# Patient Record
Sex: Male | Born: 1960 | Race: White | Hispanic: No | Marital: Married | Smoking: Former smoker
Health system: Southern US, Community
[De-identification: ages and names within clinical notes are randomized; demographics above are authoritative.]

## PROBLEM LIST (undated history)

## (undated) DIAGNOSIS — J309 Allergic rhinitis, unspecified: Secondary | ICD-10-CM

## (undated) DIAGNOSIS — F419 Anxiety disorder, unspecified: Secondary | ICD-10-CM

## (undated) DIAGNOSIS — E785 Hyperlipidemia, unspecified: Secondary | ICD-10-CM

## (undated) DIAGNOSIS — S0291XA Unspecified fracture of skull, initial encounter for closed fracture: Secondary | ICD-10-CM

## (undated) DIAGNOSIS — S060XAA Concussion with loss of consciousness status unknown, initial encounter: Secondary | ICD-10-CM

## (undated) DIAGNOSIS — H47619 Cortical blindness, unspecified side of brain: Secondary | ICD-10-CM

## (undated) DIAGNOSIS — R413 Other amnesia: Secondary | ICD-10-CM

## (undated) DIAGNOSIS — F0280 Dementia in other diseases classified elsewhere without behavioral disturbance: Secondary | ICD-10-CM

## (undated) DIAGNOSIS — F32A Depression, unspecified: Secondary | ICD-10-CM

## (undated) DIAGNOSIS — R269 Unspecified abnormalities of gait and mobility: Secondary | ICD-10-CM

## (undated) DIAGNOSIS — Z8669 Personal history of other diseases of the nervous system and sense organs: Secondary | ICD-10-CM

## (undated) HISTORY — DX: Other amnesia: R41.3

## (undated) HISTORY — PX: COLONOSCOPY: SHX174

## (undated) HISTORY — DX: Allergic rhinitis, unspecified: J30.9

## (undated) HISTORY — DX: Cortical blindness, unspecified side of brain: H47.619

## (undated) HISTORY — DX: Personal history of other diseases of the nervous system and sense organs: Z86.69

## (undated) HISTORY — DX: Concussion with loss of consciousness status unknown, initial encounter: S06.0XAA

## (undated) HISTORY — DX: Unspecified abnormalities of gait and mobility: R26.9

## (undated) HISTORY — DX: Anxiety disorder, unspecified: F41.9

## (undated) HISTORY — DX: Depression, unspecified: F32.A

## (undated) HISTORY — DX: Hyperlipidemia, unspecified: E78.5

## (undated) HISTORY — DX: Unspecified fracture of skull, initial encounter for closed fracture: S02.91XA

## (undated) HISTORY — PX: POLYPECTOMY: SHX149

---
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
## 1984-02-27 HISTORY — PX: OTHER SURGICAL HISTORY: SHX169

## 1998-02-12 ENCOUNTER — Encounter: Payer: Self-pay | Admitting: Emergency Medicine

## 1998-02-12 ENCOUNTER — Encounter: Payer: Self-pay | Admitting: Orthopedic Surgery

## 1998-02-12 ENCOUNTER — Inpatient Hospital Stay (HOSPITAL_COMMUNITY): Admission: EM | Admit: 1998-02-12 | Discharge: 1998-02-15 | Payer: Self-pay | Admitting: Emergency Medicine

## 2000-02-27 HISTORY — PX: FEMUR FRACTURE SURGERY: SHX633

## 2000-09-27 ENCOUNTER — Emergency Department (HOSPITAL_COMMUNITY): Admission: EM | Admit: 2000-09-27 | Discharge: 2000-09-27 | Payer: Self-pay | Admitting: Emergency Medicine

## 2000-09-27 ENCOUNTER — Encounter: Payer: Self-pay | Admitting: Emergency Medicine

## 2004-05-05 ENCOUNTER — Ambulatory Visit (HOSPITAL_COMMUNITY): Admission: RE | Admit: 2004-05-05 | Discharge: 2004-05-05 | Payer: Self-pay | Admitting: Internal Medicine

## 2004-10-11 ENCOUNTER — Ambulatory Visit: Payer: Self-pay | Admitting: Internal Medicine

## 2004-10-27 ENCOUNTER — Ambulatory Visit: Payer: Self-pay | Admitting: Internal Medicine

## 2004-10-27 ENCOUNTER — Encounter (INDEPENDENT_AMBULATORY_CARE_PROVIDER_SITE_OTHER): Payer: Self-pay | Admitting: Specialist

## 2008-10-04 ENCOUNTER — Ambulatory Visit: Payer: Self-pay | Admitting: Internal Medicine

## 2008-10-22 ENCOUNTER — Encounter: Payer: Self-pay | Admitting: Internal Medicine

## 2008-10-22 ENCOUNTER — Ambulatory Visit: Payer: Self-pay | Admitting: Internal Medicine

## 2008-10-25 ENCOUNTER — Encounter: Payer: Self-pay | Admitting: Internal Medicine

## 2010-10-31 ENCOUNTER — Ambulatory Visit (INDEPENDENT_AMBULATORY_CARE_PROVIDER_SITE_OTHER): Payer: BC Managed Care – PPO | Admitting: Urology

## 2010-10-31 DIAGNOSIS — R361 Hematospermia: Secondary | ICD-10-CM

## 2011-10-30 ENCOUNTER — Encounter: Payer: Self-pay | Admitting: Internal Medicine

## 2012-03-11 ENCOUNTER — Ambulatory Visit (INDEPENDENT_AMBULATORY_CARE_PROVIDER_SITE_OTHER): Payer: BC Managed Care – PPO | Admitting: Urology

## 2012-03-11 DIAGNOSIS — R6882 Decreased libido: Secondary | ICD-10-CM

## 2012-03-11 DIAGNOSIS — N529 Male erectile dysfunction, unspecified: Secondary | ICD-10-CM

## 2012-03-11 DIAGNOSIS — R361 Hematospermia: Secondary | ICD-10-CM

## 2012-06-19 ENCOUNTER — Encounter: Payer: Self-pay | Admitting: Internal Medicine

## 2012-11-17 ENCOUNTER — Encounter: Payer: Self-pay | Admitting: Internal Medicine

## 2013-01-15 ENCOUNTER — Ambulatory Visit (AMBULATORY_SURGERY_CENTER): Payer: Self-pay | Admitting: *Deleted

## 2013-01-15 VITALS — Ht 68.0 in | Wt 171.8 lb

## 2013-01-15 DIAGNOSIS — Z86010 Personal history of colonic polyps: Secondary | ICD-10-CM

## 2013-01-15 MED ORDER — MOVIPREP 100 G PO SOLR: ORAL | Status: DC

## 2013-01-15 NOTE — Progress Notes (Signed)
No allergies to eggs or soy. No problems with anesthesia.  

## 2013-01-16 ENCOUNTER — Encounter: Payer: Self-pay | Admitting: Internal Medicine

## 2013-01-30 ENCOUNTER — Ambulatory Visit (AMBULATORY_SURGERY_CENTER): Payer: BC Managed Care – PPO | Admitting: Internal Medicine

## 2013-01-30 ENCOUNTER — Encounter: Payer: Self-pay | Admitting: Internal Medicine

## 2013-01-30 VITALS — BP 120/72 | HR 56 | Temp 97.0°F | Resp 14 | Ht 68.0 in | Wt 171.0 lb

## 2013-01-30 DIAGNOSIS — Z86010 Personal history of colonic polyps: Secondary | ICD-10-CM

## 2013-01-30 DIAGNOSIS — D126 Benign neoplasm of colon, unspecified: Secondary | ICD-10-CM

## 2013-01-30 MED ORDER — SODIUM CHLORIDE 0.9 % IV SOLN: 500.0000 mL | INTRAVENOUS | Status: DC

## 2013-01-30 NOTE — Progress Notes (Signed)
Patient did not have preoperative order for IV antibiotic SSI prophylaxis. (G8918)  Patient did not experience any of the following events: a burn prior to discharge; a fall within the facility; wrong site/side/patient/procedure/implant event; or a hospital transfer or hospital admission upon discharge from the facility. (G8907)  

## 2013-01-30 NOTE — Progress Notes (Signed)
Called to room to assist during endoscopic procedure.  Patient ID and intended procedure confirmed with present staff. Received instructions for my participation in the procedure from the performing physician.  

## 2013-01-30 NOTE — Patient Instructions (Signed)
YOU HAD AN ENDOSCOPIC PROCEDURE TODAY AT THE [facility]: Refer to the procedure report that was given to you for any specific questions about what was found during the examination.  If the procedure report does not answer your questions, please call your gastroenterologist to clarify.  If you requested that your care partner not be given the details of your procedure findings, then the procedure report has been included in a sealed envelope for you to review at your convenience later.  YOU SHOULD EXPECT: Some feelings of bloating in the abdomen. Passage of more gas than usual.  Walking can help get rid of the air that was put into your GI tract during the procedure and reduce the bloating. If you had a lower endoscopy (such as a colonoscopy or flexible sigmoidoscopy) you may notice spotting of blood in your stool or on the toilet paper. If you underwent a bowel prep for your procedure, then you may not have a normal bowel movement for a few days.  DIET: Your first meal following the procedure should be a light meal and then it is ok to progress to your normal diet.  A half-sandwich or bowl of soup is an example of a good first meal.  Heavy or fried foods are harder to digest and may make you feel nauseous or bloated.  Likewise meals heavy in dairy and vegetables can cause extra gas to form and this can also increase the bloating.  Drink plenty of fluids but you should avoid alcoholic beverages for 24 hours.  ACTIVITY: Your care partner should take you home directly after the procedure.  You should plan to take it easy, moving slowly for the rest of the day.  You can resume normal activity the day after the procedure however you should NOT DRIVE or use heavy machinery for 24 hours (because of the sedation medicines used during the test).    SYMPTOMS TO REPORT IMMEDIATELY: A gastroenterologist can be reached at any hour.  During normal business hours, 8:30 AM to 5:00 PM Monday through Friday,  call [phone].  After hours and on weekends, please call the GI answering service at [phone] who will take a message and have the physician on call contact you.   Following lower endoscopy (colonoscopy or flexible sigmoidoscopy):  Excessive amounts of blood in the stool  Significant tenderness or worsening of abdominal pains  Swelling of the abdomen that is new, acute  Fever of 100F or higher  FOLLOW UP: If any biopsies were taken you will be contacted by phone or by letter within the next 1-3 weeks.  Call your gastroenterologist if you have not heard about the biopsies in 3 weeks.  Our staff will call the home number listed on your records the next business day following your procedure to check on you and address any questions or concerns that you may have at that time regarding the information given to you following your procedure. This is a courtesy call and so if there is no answer at the home number and we have not heard from you through the emergency physician on call, we will assume that you have returned to your regular daily activities without incident.  SIGNATURES/CONFIDENTIALITY: You and/or your care partner have signed paperwork which will be entered into your electronic medical record.  These signatures attest to the fact that that the information above on your After Visit Summary has been reviewed and is understood.  Full responsibility of the confidentiality of this   discharge information lies with you and/or your care-partner.  Recommendations See procedure report  

## 2013-01-30 NOTE — Op Note (Signed)
[facility] [address] [place], [address]   COLONOSCOPY PROCEDURE REPORT  PATIENT: Jeremy Cain, Jeremy Cain  MR#: [id number] BIRTHDATE: 01-12-1961 , 52  yrs. old GENDER: Male ENDOSCOPIST: Roxy Cedar, MD REFERRED XB:[id number] Program Recall PROCEDURE DATE:  01/30/2013 PROCEDURE:   Colonoscopy with snare polypectomy x 3 First Screening Colonoscopy - Avg.  risk and is 50 yrs.  old or older - No.  Prior Negative Screening - Now for repeat screening. N/A  History of Adenoma - Now for follow-up colonoscopy & has been > or = to 3 yrs.  Yes hx of adenoma.  Has been 3 or more years since last colonoscopy.  Polyps Removed Today? Yes. ASA CLASS:   Class II INDICATIONS:Patient's personal history of adenomatous colon polyps(September 2006 with 20 mm tubulovillous adenoma. Last exam August 2010 with several sessile serrated adenomas) and Patient's immediate family history of colon cancer. MEDICATIONS: MAC sedation, administered by CRNA and propofol (Diprivan) 350mg  IV  DESCRIPTION OF PROCEDURE:   After the risks benefits and alternatives of the procedure were thoroughly explained, informed consent was obtained.  A digital rectal exam revealed no abnormalities of the rectum.   The LB [id number]  endoscope was introduced through the anus and advanced to the cecum, which was identified by both the appendix and ileocecal valve. No adverse events experienced.   The quality of the prep was good, using MoviPrep  The instrument was then slowly withdrawn as the colon was fully examined.  COLON FINDINGS: Three polyps were found in the ascending colon.Two 4 mm and one 9 mm sessile lesion.  A polypectomy was performed with a cold snare.  The resection was complete and the polyp tissue was completely retrieved.   Moderate diverticulosis was noted The finding was in the left colon.   The colon mucosa was otherwise normal.  Retroflexed views revealed internal hemorrhoids. The  time to cecum=1 minutes 59 seconds.  Withdrawal time=15 minutes 59 seconds.  The scope was withdrawn and the procedure completed. COMPLICATIONS: There were no complications.  ENDOSCOPIC IMPRESSION: 1.   Three polyps were found in the ascending colon; polypectomy was performed with a cold snare 2.   Moderate diverticulosis was noted in the left colon 3.   The colon mucosa was otherwise normal  RECOMMENDATIONS: 1.Repeat Colonoscopy in 3 years.   eSigned:  Roxy Cedar, MD 01/30/2013 10:27 AM   cc: Karleen Hampshire, MD and The Patient   PATIENT NAME:  Jeremy Cain, Jeremy Cain MR#: [id number]

## 2013-02-02 ENCOUNTER — Telehealth: Payer: Self-pay | Admitting: *Deleted

## 2013-02-02 NOTE — Telephone Encounter (Signed)
  Follow up Call-  Call back number 01/30/2013  Post procedure Call Back phone  # [phone]  Permission to leave phone message Yes     Patient questions:  Do you have a fever, pain , or abdominal swelling? no Pain Score  0 *  Have you tolerated food without any problems? yes  Have you been able to return to your normal activities? yes  Do you have any questions about your discharge instructions: Diet   no Medications  no Follow up visit  no  Do you have questions or concerns about your Care? no  Actions: * If pain score is 4 or above: No action needed, pain <4.

## 2013-02-03 ENCOUNTER — Encounter: Payer: Self-pay | Admitting: Internal Medicine

## 2014-04-20 ENCOUNTER — Ambulatory Visit (INDEPENDENT_AMBULATORY_CARE_PROVIDER_SITE_OTHER): Payer: BLUE CROSS/BLUE SHIELD | Admitting: Urology

## 2014-04-20 DIAGNOSIS — R361 Hematospermia: Secondary | ICD-10-CM

## 2014-04-20 DIAGNOSIS — N5201 Erectile dysfunction due to arterial insufficiency: Secondary | ICD-10-CM

## 2015-11-30 ENCOUNTER — Encounter: Payer: Self-pay | Admitting: Gastroenterology

## 2015-12-07 ENCOUNTER — Encounter: Payer: Self-pay | Admitting: Internal Medicine

## 2016-02-14 ENCOUNTER — Ambulatory Visit (INDEPENDENT_AMBULATORY_CARE_PROVIDER_SITE_OTHER): Payer: BLUE CROSS/BLUE SHIELD | Admitting: Urology

## 2016-02-14 DIAGNOSIS — N486 Induration penis plastica: Secondary | ICD-10-CM | POA: Diagnosis not present

## 2016-04-10 ENCOUNTER — Encounter: Payer: Self-pay | Admitting: Internal Medicine

## 2016-06-07 ENCOUNTER — Ambulatory Visit (AMBULATORY_SURGERY_CENTER): Payer: Self-pay | Admitting: *Deleted

## 2016-06-07 VITALS — Ht 67.0 in | Wt 161.8 lb

## 2016-06-07 DIAGNOSIS — Z86010 Personal history of colonic polyps: Secondary | ICD-10-CM

## 2016-06-07 DIAGNOSIS — Z800 Family history of malignant neoplasm of digestive organs: Secondary | ICD-10-CM

## 2016-06-07 MED ORDER — NA SULFATE-K SULFATE-MG SULF 17.5-3.13-1.6 GM/177ML PO SOLN: ORAL | 0 refills | Status: DC

## 2016-06-07 NOTE — Progress Notes (Signed)
Patient does NOT have an E-mail address.

## 2016-06-07 NOTE — Progress Notes (Signed)
Pt denies allergies to eggs or soy products. Denies difficulty with sedation or anesthesia. Denies any diet or weight loss medications. Denies use of supplemental oxygen.  Emmi instructions given for procedure.  

## 2016-06-12 ENCOUNTER — Encounter: Payer: Self-pay | Admitting: Internal Medicine

## 2016-06-14 ENCOUNTER — Other Ambulatory Visit: Payer: Self-pay

## 2016-06-14 DIAGNOSIS — Z800 Family history of malignant neoplasm of digestive organs: Secondary | ICD-10-CM

## 2016-06-14 DIAGNOSIS — Z86010 Personal history of colonic polyps: Secondary | ICD-10-CM

## 2016-06-14 MED ORDER — NA SULFATE-K SULFATE-MG SULF 17.5-3.13-1.6 GM/177ML PO SOLN: ORAL | 0 refills | Status: DC

## 2016-06-21 ENCOUNTER — Encounter: Payer: Self-pay | Admitting: Internal Medicine

## 2016-06-21 ENCOUNTER — Ambulatory Visit (AMBULATORY_SURGERY_CENTER): Payer: BLUE CROSS/BLUE SHIELD | Admitting: Internal Medicine

## 2016-06-21 VITALS — BP 116/80 | HR 53 | Temp 97.8°F | Resp 9 | Ht 67.0 in | Wt 161.0 lb

## 2016-06-21 DIAGNOSIS — Z800 Family history of malignant neoplasm of digestive organs: Secondary | ICD-10-CM

## 2016-06-21 DIAGNOSIS — Z86010 Personal history of colonic polyps: Secondary | ICD-10-CM | POA: Diagnosis not present

## 2016-06-21 DIAGNOSIS — D122 Benign neoplasm of ascending colon: Secondary | ICD-10-CM

## 2016-06-21 MED ORDER — SODIUM CHLORIDE 0.9 % IV SOLN: 500.0000 mL | INTRAVENOUS | Status: DC

## 2016-06-21 NOTE — Progress Notes (Signed)
Called to room to assist during endoscopic procedure.  Patient ID and intended procedure confirmed with present staff. Received instructions for my participation in the procedure from the performing physician.  

## 2016-06-21 NOTE — Patient Instructions (Signed)
YOU HAD AN ENDOSCOPIC PROCEDURE TODAY AT [facility]:   Refer to the procedure report that was given to you for any specific questions about what was found during the examination.  If the procedure report does not answer your questions, please call your gastroenterologist to clarify.  If you requested that your care partner not be given the details of your procedure findings, then the procedure report has been included in a sealed envelope for you to review at your convenience later.  YOU SHOULD EXPECT: Some feelings of bloating in the abdomen. Passage of more gas than usual.  Walking can help get rid of the air that was put into your GI tract during the procedure and reduce the bloating. If you had a lower endoscopy (such as a colonoscopy or flexible sigmoidoscopy) you may notice spotting of blood in your stool or on the toilet paper. If you underwent a bowel prep for your procedure, you may not have a normal bowel movement for a few days.  Please Note:  You might notice some irritation and congestion in your nose or some drainage.  This is from the oxygen used during your procedure.  There is no need for concern and it should clear up in a day or so.  SYMPTOMS TO REPORT IMMEDIATELY:   Following lower endoscopy (colonoscopy or flexible sigmoidoscopy):  Excessive amounts of blood in the stool  Significant tenderness or worsening of abdominal pains  Swelling of the abdomen that is new, acute  Fever of 100F or higher        For urgent or emergent issues, a gastroenterologist can be reached at any hour by calling [phone].   DIET:  We do recommend a small meal at first, but then you may proceed to your regular diet.  Drink plenty of fluids but you should avoid alcoholic beverages for 24 hours.  ACTIVITY:  You should plan to take it easy for the rest of today and you should NOT DRIVE or use heavy machinery until tomorrow (because of the sedation medicines used during the  test).    FOLLOW UP: Our staff will call the number listed on your records the next business day following your procedure to check on you and address any questions or concerns that you may have regarding the information given to you following your procedure. If we do not reach you, we will leave a message.  However, if you are feeling well and you are not experiencing any problems, there is no need to return our call.  We will assume that you have returned to your regular daily activities without incident.  If any biopsies were taken you will be contacted by phone or by letter within the next 1-3 weeks.  Please call us at [phone] if you have not heard about the biopsies in 3 weeks.    SIGNATURES/CONFIDENTIALITY: You and/or your care partner have signed paperwork which will be entered into your electronic medical record.  These signatures attest to the fact that that the information above on your After Visit Summary has been reviewed and is understood.  Full responsibility of the confidentiality of this discharge information lies with you and/or your care-partner.    INFORMATION ON POLYPS.DIVERTICULOSIS ,HEMORRHOIDS ,AND HIGH FIBER DIET GIVEN TO YOU TODAY  AWAIT LETTER FROM DR Henrene Pastor WITH PATHOLOGY RESULTS OF POLYP REMOVED TODAY

## 2016-06-21 NOTE — Progress Notes (Signed)
Patient awakening,vss,report to rn 

## 2016-06-21 NOTE — Op Note (Signed)
[facility] Patient Name: Jeremy Cain Procedure Date: 06/21/2016 11:15 AM MRN: [id number] Endoscopist: Docia Chuck. Henrene Pastor , MD Age: 56 Referring MD:  Date of Birth: November 23, 1960 Gender: Male Account #: [id number] Procedure:                Colonoscopy, with cold snare polypectomy X 1 Indications:              High risk colon cancer surveillance: Personal                            history of adenoma (10 mm or greater in size), High                            risk colon cancer surveillance: Personal history of                            adenoma with villous component, High risk colon                            cancer surveillance: Personal history of multiple                            (3 or more) adenomas. Previous examinations 2006,                            2010, 2014. Father with history of colorectal cancer Medicines:                Monitored Anesthesia Care Procedure:                Pre-Anesthesia Assessment:                           - Prior to the procedure, a History and Physical                            was performed, and patient medications and                            allergies were reviewed. The patient's tolerance of                            previous anesthesia was also reviewed. The risks                            and benefits of the procedure and the sedation                            options and risks were discussed with the patient.                            All questions were answered, and informed consent                            was obtained. Prior Anticoagulants: The patient has  taken no previous anticoagulant or antiplatelet                            agents. ASA Grade Assessment: II - A patient with                            mild systemic disease. After reviewing the risks                            and benefits, the patient was deemed in                            satisfactory condition to undergo the procedure.                        After obtaining informed consent, the colonoscope                            was passed under direct vision. Throughout the                            procedure, the patient's blood pressure, pulse, and                            oxygen saturations were monitored continuously. The                            Colonoscope was introduced through the anus and                            advanced to the the cecum, identified by                            appendiceal orifice and ileocecal valve. The                            ileocecal valve, appendiceal orifice, and rectum                            were photographed. The quality of the bowel                            preparation was good. The colonoscopy was performed                            without difficulty. The patient tolerated the                            procedure well. The bowel preparation used was                            SUPREP. Scope In: 11:25:17 AM Scope Out: 11:39:59 AM Scope Withdrawal Time: 0 hours 12 minutes 30 seconds  Total Procedure Duration: 0 hours 14 minutes 42 seconds  Findings:  A 2 mm polyp was found in the ascending colon. The                            polyp was removed with a cold snare. Resection and                            retrieval were complete.                           Diverticula were found in the sigmoid colon.                           Internal hemorrhoids were found during retroflexion. Complications:            No immediate complications. Estimated blood loss:                            None. Estimated Blood Loss:     Estimated blood loss: none. Impression:               - One 2 mm polyp in the ascending colon, removed                            with a cold snare. Resected and retrieved.                           - Diverticulosis in the sigmoid colon.                           - Internal hemorrhoids. Recommendation:           - Repeat colonoscopy in 5 years  for surveillance.                           - Patient has a contact number available for                            emergencies. The signs and symptoms of potential                            delayed complications were discussed with the                            patient. Return to normal activities tomorrow.                            Written discharge instructions were provided to the                            patient.                           - Resume previous diet.                           - Continue present medications.                           -  Await pathology results. Docia Chuck. Henrene Pastor, MD 06/21/2016 11:45:36 AM This report has been signed electronically.

## 2016-06-22 ENCOUNTER — Telehealth: Payer: Self-pay

## 2016-06-22 NOTE — Telephone Encounter (Signed)
  Follow up Call-  Call back number 06/21/2016  Post procedure Call Back phone  # [phone]  Permission to leave phone message Yes  Some recent data might be hidden     Patient questions:  Do you have a fever, pain , or abdominal swelling? No. Pain Score  0 *  Have you tolerated food without any problems? Yes.    Have you been able to return to your normal activities? Yes.    Do you have any questions about your discharge instructions: Diet   No. Medications  No. Follow up visit  No.  Do you have questions or concerns about your Care? No.  Actions: * If pain score is 4 or above: No action needed, pain <4.

## 2016-06-27 ENCOUNTER — Encounter: Payer: Self-pay | Admitting: Internal Medicine

## 2017-03-01 DIAGNOSIS — Z6824 Body mass index (BMI) 24.0-24.9, adult: Secondary | ICD-10-CM | POA: Diagnosis not present

## 2017-03-08 DIAGNOSIS — H16223 Keratoconjunctivitis sicca, not specified as Sjogren's, bilateral: Secondary | ICD-10-CM | POA: Diagnosis not present

## 2017-03-15 DIAGNOSIS — H04129 Dry eye syndrome of unspecified lacrimal gland: Secondary | ICD-10-CM | POA: Diagnosis not present

## 2017-03-15 DIAGNOSIS — H01003 Unspecified blepharitis right eye, unspecified eyelid: Secondary | ICD-10-CM | POA: Diagnosis not present

## 2017-04-19 DIAGNOSIS — H01003 Unspecified blepharitis right eye, unspecified eyelid: Secondary | ICD-10-CM | POA: Diagnosis not present

## 2017-05-09 DIAGNOSIS — H01003 Unspecified blepharitis right eye, unspecified eyelid: Secondary | ICD-10-CM | POA: Diagnosis not present

## 2017-05-16 DIAGNOSIS — R413 Other amnesia: Secondary | ICD-10-CM | POA: Diagnosis not present

## 2017-05-16 DIAGNOSIS — Z6824 Body mass index (BMI) 24.0-24.9, adult: Secondary | ICD-10-CM | POA: Diagnosis not present

## 2017-05-29 DIAGNOSIS — E039 Hypothyroidism, unspecified: Secondary | ICD-10-CM | POA: Diagnosis not present

## 2017-05-29 DIAGNOSIS — R5383 Other fatigue: Secondary | ICD-10-CM | POA: Diagnosis not present

## 2017-05-29 DIAGNOSIS — R413 Other amnesia: Secondary | ICD-10-CM | POA: Diagnosis not present

## 2017-05-29 DIAGNOSIS — H2589 Other age-related cataract: Secondary | ICD-10-CM | POA: Diagnosis not present

## 2017-05-29 DIAGNOSIS — D519 Vitamin B12 deficiency anemia, unspecified: Secondary | ICD-10-CM | POA: Diagnosis not present

## 2017-05-30 ENCOUNTER — Other Ambulatory Visit (HOSPITAL_COMMUNITY): Payer: Self-pay | Admitting: Neurology

## 2017-05-30 DIAGNOSIS — R413 Other amnesia: Secondary | ICD-10-CM

## 2017-05-30 DIAGNOSIS — F0390 Unspecified dementia without behavioral disturbance: Secondary | ICD-10-CM

## 2017-05-31 DIAGNOSIS — H25811 Combined forms of age-related cataract, right eye: Secondary | ICD-10-CM | POA: Diagnosis not present

## 2017-05-31 DIAGNOSIS — H25812 Combined forms of age-related cataract, left eye: Secondary | ICD-10-CM | POA: Diagnosis not present

## 2017-06-06 DIAGNOSIS — H2512 Age-related nuclear cataract, left eye: Secondary | ICD-10-CM | POA: Diagnosis not present

## 2017-06-06 DIAGNOSIS — H25812 Combined forms of age-related cataract, left eye: Secondary | ICD-10-CM | POA: Diagnosis not present

## 2017-06-10 ENCOUNTER — Ambulatory Visit (HOSPITAL_COMMUNITY)
Admission: RE | Admit: 2017-06-10 | Discharge: 2017-06-10 | Disposition: A | Discharge status: Home / Self Care | Payer: 59 | Source: Ambulatory Visit | Attending: Neurology | Admitting: Neurology

## 2017-06-10 DIAGNOSIS — F0390 Unspecified dementia without behavioral disturbance: Secondary | ICD-10-CM | POA: Insufficient documentation

## 2017-06-10 DIAGNOSIS — I6782 Cerebral ischemia: Secondary | ICD-10-CM | POA: Diagnosis not present

## 2017-06-10 DIAGNOSIS — G319 Degenerative disease of nervous system, unspecified: Secondary | ICD-10-CM | POA: Diagnosis not present

## 2017-06-10 DIAGNOSIS — R413 Other amnesia: Secondary | ICD-10-CM | POA: Insufficient documentation

## 2017-06-12 DIAGNOSIS — R413 Other amnesia: Secondary | ICD-10-CM | POA: Diagnosis not present

## 2017-06-12 DIAGNOSIS — J3089 Other allergic rhinitis: Secondary | ICD-10-CM | POA: Diagnosis not present

## 2017-07-05 DIAGNOSIS — H2511 Age-related nuclear cataract, right eye: Secondary | ICD-10-CM | POA: Diagnosis not present

## 2017-07-05 DIAGNOSIS — H25811 Combined forms of age-related cataract, right eye: Secondary | ICD-10-CM | POA: Diagnosis not present

## 2017-07-24 DIAGNOSIS — J3089 Other allergic rhinitis: Secondary | ICD-10-CM | POA: Diagnosis not present

## 2017-07-24 DIAGNOSIS — R413 Other amnesia: Secondary | ICD-10-CM | POA: Diagnosis not present

## 2017-07-25 ENCOUNTER — Encounter

## 2017-07-25 ENCOUNTER — Ambulatory Visit: Payer: BLUE CROSS/BLUE SHIELD | Admitting: Neurology

## 2017-10-24 DIAGNOSIS — G300 Alzheimer's disease with early onset: Secondary | ICD-10-CM | POA: Diagnosis not present

## 2017-10-24 DIAGNOSIS — H2589 Other age-related cataract: Secondary | ICD-10-CM | POA: Diagnosis not present

## 2017-11-17 DIAGNOSIS — Z23 Encounter for immunization: Secondary | ICD-10-CM | POA: Diagnosis not present

## 2017-11-25 DIAGNOSIS — G300 Alzheimer's disease with early onset: Secondary | ICD-10-CM | POA: Diagnosis not present

## 2017-11-25 DIAGNOSIS — H2589 Other age-related cataract: Secondary | ICD-10-CM | POA: Diagnosis not present

## 2018-03-25 DIAGNOSIS — H2589 Other age-related cataract: Secondary | ICD-10-CM | POA: Diagnosis not present

## 2018-03-25 DIAGNOSIS — G300 Alzheimer's disease with early onset: Secondary | ICD-10-CM | POA: Diagnosis not present

## 2018-05-16 DIAGNOSIS — G309 Alzheimer's disease, unspecified: Secondary | ICD-10-CM | POA: Diagnosis not present

## 2018-05-16 DIAGNOSIS — Z6826 Body mass index (BMI) 26.0-26.9, adult: Secondary | ICD-10-CM | POA: Diagnosis not present

## 2018-05-16 DIAGNOSIS — Z1389 Encounter for screening for other disorder: Secondary | ICD-10-CM | POA: Diagnosis not present

## 2018-05-16 DIAGNOSIS — Z0001 Encounter for general adult medical examination with abnormal findings: Secondary | ICD-10-CM | POA: Diagnosis not present

## 2018-05-16 DIAGNOSIS — E663 Overweight: Secondary | ICD-10-CM | POA: Diagnosis not present

## 2019-02-05 ENCOUNTER — Encounter: Payer: Self-pay | Admitting: Psychology

## 2019-02-05 ENCOUNTER — Other Ambulatory Visit: Payer: Self-pay

## 2019-02-05 ENCOUNTER — Encounter: Payer: 59 | Attending: Psychology | Admitting: Psychology

## 2019-02-05 ENCOUNTER — Encounter

## 2019-02-05 DIAGNOSIS — G300 Alzheimer's disease with early onset: Secondary | ICD-10-CM | POA: Diagnosis present

## 2019-02-05 DIAGNOSIS — F0789 Other personality and behavioral disorders due to known physiological condition: Secondary | ICD-10-CM | POA: Diagnosis not present

## 2019-02-05 DIAGNOSIS — F0280 Dementia in other diseases classified elsewhere without behavioral disturbance: Secondary | ICD-10-CM | POA: Insufficient documentation

## 2019-02-05 DIAGNOSIS — F09 Unspecified mental disorder due to known physiological condition: Secondary | ICD-10-CM
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
## 2019-02-16 ENCOUNTER — Other Ambulatory Visit: Payer: Self-pay

## 2019-02-16 ENCOUNTER — Encounter: Payer: 59 | Admitting: Psychology

## 2019-02-16 ENCOUNTER — Encounter: Payer: Self-pay | Admitting: Psychology

## 2019-02-16 DIAGNOSIS — F09 Unspecified mental disorder due to known physiological condition: Secondary | ICD-10-CM | POA: Diagnosis not present

## 2019-02-16 DIAGNOSIS — F0789 Other personality and behavioral disorders due to known physiological condition: Secondary | ICD-10-CM

## 2019-02-16 NOTE — Progress Notes (Addendum)
The patient arrived on time to his 8:00 testing appointment and was accompanied by his wife. The evaluation lasted 240 minutes.   Behavioral Observations:  Appearance: Casually and appropriately dressed with good hygiene. Gait: Ambulated independently without assistance (e.g., slow pace, little to no movement in arms when walking) Speech: Mostly clear, reduced rate, normal tone & volume Thought process:  Linear, somewhat disorganized, and concrete. Confusion and perseveration noted.  Mood/Affect:    Depressed, blunted.  Interpersonal: Polite and appropriate. Orientation: Oriented x 4 Effort/Motivation: Adequate    He did appear to have some residual visual impairment from previous cataract surgery. He had difficulty understanding the instructions for most measures and requires a significant amount of additional prompting to obtain meaningful results. He exhibited adequate distress tolerance on questions he did not know or tasks that were more difficult. 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 Block Design subtest).    Tests Administered: . Clock Drawing Test . Wechsler Adult Intelligence Scale, 4th Edition (WAIS-IV) . Wechsler Memory Scale, 4th edition, Older Adult Battery (WMS-IV-OA) Results:  Clock Drawing Test . Impaired  WAIS-IV  Composite Score Summary  Scale Sum of Scaled Scores Composite Score Percentile Rank 95% Conf. Interval Qualitative Description  Verbal Comprehension 18 VCI 78 7 73-85 Borderline  Perceptual Reasoning 5 PRI 51 0.1 47-60 Extremely Low  Working Memory 4 WMI 55 0.1 51-64 Extremely Low  Processing Speed 2 PSI 50 <0.1 47-63 Extremely Low  Full Scale 29 FSIQ 53 0.1 50-58 Extremely Low  General Ability 23 GAI 60 0.4 56-66 Extremely Low   Index Level Discrepancy Comparisons  Comparison Score 1 Score 2 Difference Critical Value .05 Significant Difference Y/N Base Rate by Overall Sample  VCI - PRI 78 51 27 8.31 Y 2.6  VCI - WMI 78 55 23 8.82  Y 3.8  VCI - PSI 78 50 28 10.19 Y 4.4  PRI - WMI 51 55 -4 9.74 N 39.5  PRI - PSI 51 50 1 11.00 N 47.0  WMI - PSI 55 50 5 11.38 N 37.7  FSIQ - GAI 53 60 -7 3.51 Y 8.1   Differences Between Subtest and Overall Mean of Subtest Scores  Subtest Subtest Scaled Score Mean Scaled Score Difference Critical Value .05 Strength or Weakness Base Rate  Block Design 1 2.90 -1.90 2.85  >25%  Similarities 8 2.90 5.10 2.82 S <1%  Digit Span 3 2.90 0.10 2.22  >25%  Matrix Reasoning 3 2.90 0.10 2.54  >25%  Vocabulary 5 2.90 2.10 2.03 S 25%  Arithmetic 1 2.90 -1.90 2.73  >25%  Symbol Search 1 2.90 -1.90 3.42  >25%  Visual Puzzles 1 2.90 -1.90 2.71  >25%  Information 5 2.90 2.10 2.19  >25%  Coding 1 2.90 -1.90 2.97  >25%   WMS-IV (Older Adult Battery)     RAW SCORES  Subtest Score Range Adult Score Range Older Adult Raw Score  Brief Cognitive Status Exam 0-58 0-58   Logical Memory I 0-50 0-53 12  Logical Memory II 0-50 0-39 0  Verbal Paired Associates I 0-56 0-40 4  Verbal Paired Associates II 0-14 0-10 1  CVLT-II Trials 1-5 5-95 5-95   CVLT-II Long-Delay -5-5 -5-5   Designs I 0-120    Designs II 0-120    Visual Reproduction I 0-43 0-43 3  Visual Reproduction II 0-43 0-43 0  Spatial Addition 0-24    Symbol Span 0-50 0-50 2  Process Score Range Adult Score Range Older Adult Raw Score  LM II Recognition 0-30 0-23 15  VPA II Recognition 0-40 0-30 13  VPA II Word Recall 0-28 0-20   DE I Content 0-48    DE I Spatial 0-24    DE II Content 0-48    DE II Spatial 0-24    DE II Recognition 0-24    VR II Recognition 0-7 0-7 0  VR II Copy 0-43 0-43      Auditory Memory Process Score Summary  Process Score Raw Score Scaled Score Percentile Rank Cumulative Percentage (Base Rate)  LM II Recognition 15 - - <=2%  VPA II Recognition 13 - - <=2%    Visual Memory Process Score Summary  Process Score Raw Score Scaled Score Percentile Rank Cumulative Percentage (Base Rate)  VR II  Recognition 0 - - <=2%

## 2019-03-10 ENCOUNTER — Ambulatory Visit: Payer: 59 | Admitting: Psychology

## 2019-03-12 ENCOUNTER — Other Ambulatory Visit: Payer: Self-pay

## 2019-03-12 ENCOUNTER — Encounter: Payer: BLUE CROSS/BLUE SHIELD | Attending: Psychology | Admitting: Psychology

## 2019-03-12 ENCOUNTER — Encounter: Payer: Self-pay | Admitting: Psychology

## 2019-03-12 DIAGNOSIS — F0280 Dementia in other diseases classified elsewhere without behavioral disturbance: Secondary | ICD-10-CM

## 2019-03-12 DIAGNOSIS — F0789 Other personality and behavioral disorders due to known physiological condition: Secondary | ICD-10-CM | POA: Diagnosis present

## 2019-03-12 DIAGNOSIS — F09 Unspecified mental disorder due to known physiological condition: Secondary | ICD-10-CM | POA: Diagnosis present

## 2019-03-12 DIAGNOSIS — G300 Alzheimer's disease with early onset: Secondary | ICD-10-CM | POA: Diagnosis present
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
## 2019-03-24 ENCOUNTER — Encounter: Payer: Self-pay | Admitting: Psychology

## 2019-03-24 ENCOUNTER — Encounter: Payer: BLUE CROSS/BLUE SHIELD | Admitting: Psychology

## 2019-03-24 ENCOUNTER — Other Ambulatory Visit: Payer: Self-pay

## 2019-03-24 DIAGNOSIS — G300 Alzheimer's disease with early onset: Secondary | ICD-10-CM | POA: Diagnosis not present

## 2019-03-24 DIAGNOSIS — F0789 Other personality and behavioral disorders due to known physiological condition: Secondary | ICD-10-CM

## 2019-03-24 DIAGNOSIS — F09 Unspecified mental disorder due to known physiological condition: Secondary | ICD-10-CM

## 2019-03-24 DIAGNOSIS — F0280 Dementia in other diseases classified elsewhere without behavioral disturbance: Secondary | ICD-10-CM

## 2019-03-24 NOTE — Progress Notes (Signed)
Today I provided feedback to the patient and his wife regarding the results of the recent neuropsychological evaluation.  The complete report can be found in the patient's chart dated 03/12/2019.  Below you will find the summary and diagnostic impressions from that evaluation for convenience.  Today's visit was a 1 hour face-to-face clinical interview with the patient and his wife that was conducted in my outpatient clinic office.  The patient was able to understand the feedback and we reviewed the results of the testing suggesting early onset Alzheimer's dementia, which was not a particular surprise to the patient as he was concerned about his subjective symptoms he has had developing over the past several years.  We worked on treatment strategies going forward and the patient has a follow-up appointment with Dr. Merlene Laughter in the near future to review neurological options.    Summary of Results:                        Overall, the results of the current objective neuropsychological evaluation are consistent with significant ongoing cognitive deficits.  As much as we could, we adjusted for the visual impairments he has from previous cataract surgery and made great effort to ensure that he fully understood the instructions and requirements of each task given.  The patient showed significant deficits with regard to retrieval of information that should be long-term such as his vocabulary and general fund of information.  The patient displayed significant deficits for visual-spatial and visual constructional issues, significant visual reasoning and problem-solving abilities and significant auditory encoding deficits.  The patient showed average performance with regard to verbal reasoning and problem-solving and his social judgment and comprehension abilities.  The patient showed profound memory deficits both visual and auditory that were beyond those that could be explained by his impaired encoding functions and the  patient's memory functions did not improve with recognition formats and cueing.  Impression/Diagnosis:                     The results of the current neuropsychological evaluation are consistent with patterns typically seen with early onset dementia of the Alzheimer's type.  There is a family history of this diagnosis and early onset as well.  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  The patient is no longer driving at all.  The patient's MRI a year and a half ago indicated moderate atrophy and only mild chronic microvascular ischemia.  These findings would not suggest cerebrovascular disease sufficient to explain this level of cognitive and memory impairments.  While the patient and the family reports some issues with tremor there are no visual hallucinations reported and the tremor is mild and not associated with as an early symptom of his deficits versus the memory and cognitive functioning to be an early symptoms.  The pattern is not consistent with Lewy body dementia or Parkinson's or other cortically or subcortically mediated progressive degenerative dementia processes.  I will provide feedback to the patient and his family regarding the results of the current neuropsychological evaluation.  Diagnosis:                               Axis I: Early onset Alzheimer's  dementia without behavioral disturbance (HCC)  Cognitive and neurobehavioral dysfunction   Ilean Skill, Psy.D. Neuropsychologist

## 2019-04-03 IMAGING — MR MR HEAD W/O CM
8 of 10 series · 42 of 48 positions shown · non-contrast
Comparison: None.

CLINICAL DATA: Dementia without behavioral disturbance.

EXAM:
MRI HEAD WITHOUT CONTRAST
TECHNIQUE: Multiplanar, multiecho pulse sequences of the brain and surrounding
structures were obtained without intravenous contrast.

[Series 3: DWI · axial · 3.0mm · 0.69mm/px · z∈[-75,+87]mm · 7 of 55 slices shown (1 of 4)]
[im 1/55]
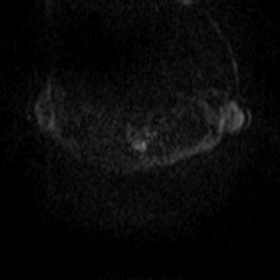
[im 10/55]
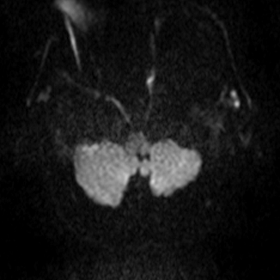
[im 19/55]
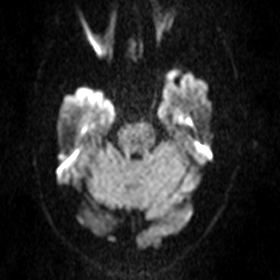
[im 28/55]
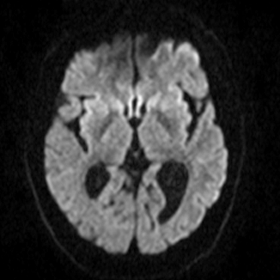
[im 37/55]
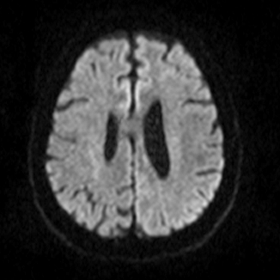
[im 46/55]
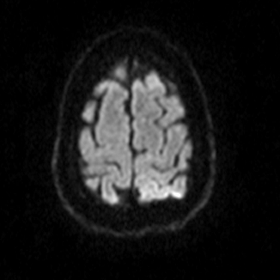
[im 55/55]
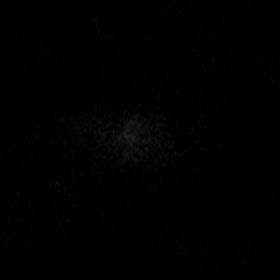

[Series 4: DWI · axial · 3.0mm · 0.72mm/px · z∈[-75,+87]mm · 7 of 55 slices shown (2 of 4)]
[im 1/55]
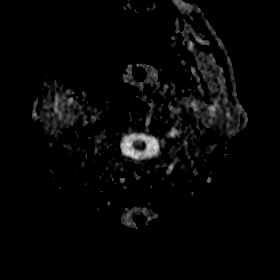
[im 10/55]
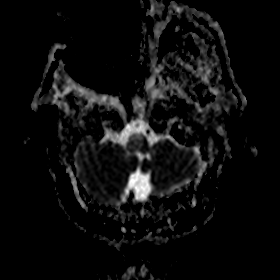
[im 19/55]
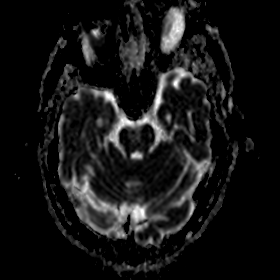
[im 28/55]
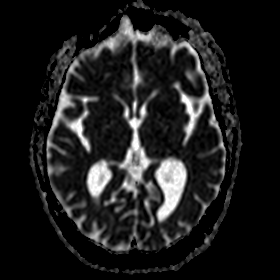
[im 37/55]
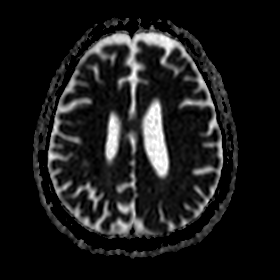
[im 46/55]
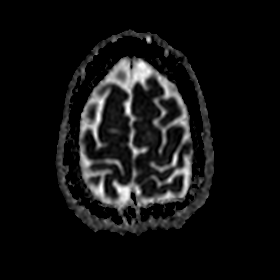
[im 55/55]
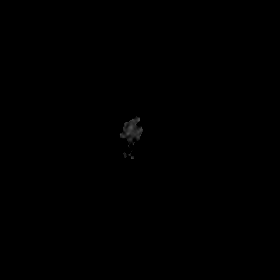

[Series 5: DWI · coronal · 5.0mm · 0.44mm/px · 4 of 34 slices shown (3 of 4)]
[im 1/34]
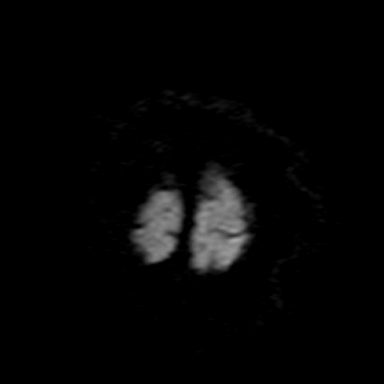
[im 12/34]
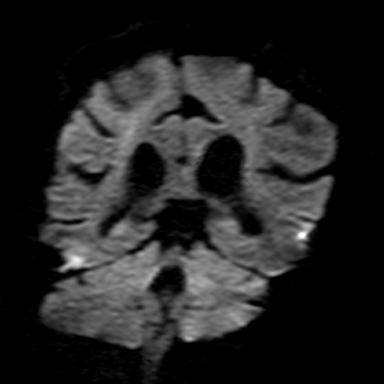
[im 23/34]
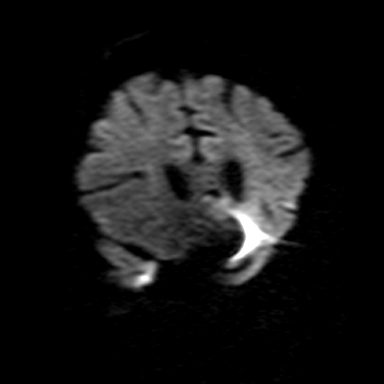
[im 34/34]
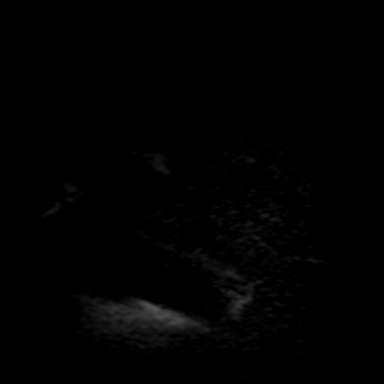

[Series 6: DWI · coronal · 5.0mm · 0.47mm/px · 4 of 34 slices shown (4 of 4)]
[im 1/34]
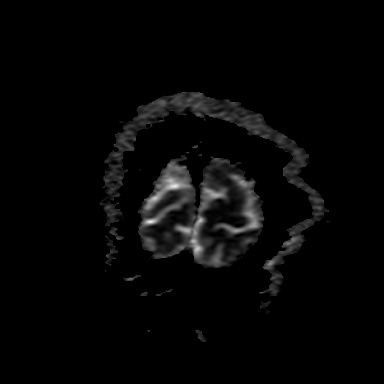
[im 12/34]
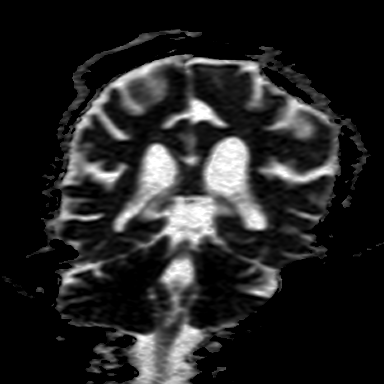
[im 23/34]
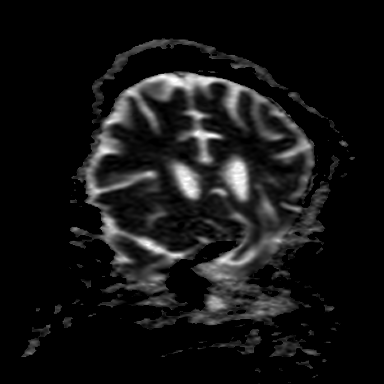
[im 34/34]
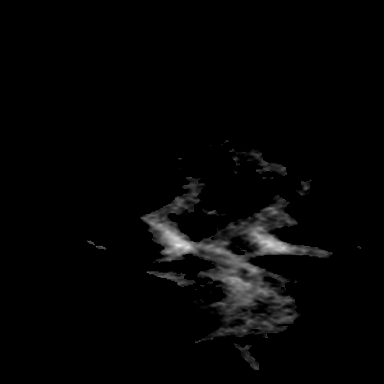

[Series 7: T2 · axial · 5.0mm · 0.63mm/px · z∈[-65,+78]mm · 3 of 23 slices shown (1 of 2)]
[im 1/23]
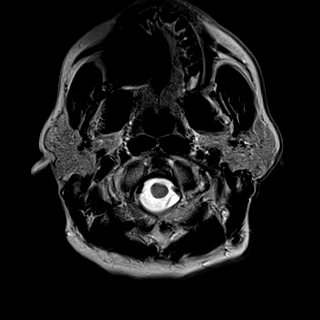
[im 12/23]
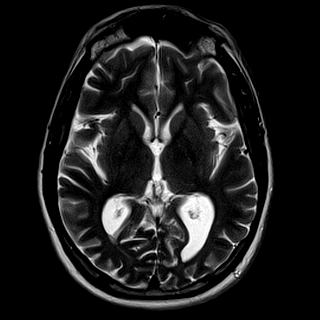
[im 23/23]
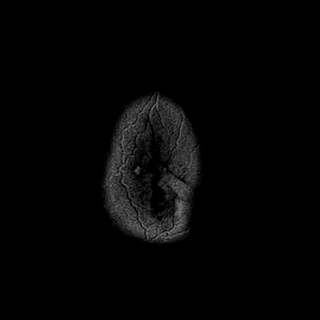

[Series 8: FLAIR · axial · 3.0mm · 0.82mm/px · z∈[-62,+75]mm · 6 of 47 slices shown]
[im 1/47]
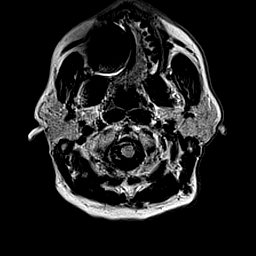
[im 10/47]
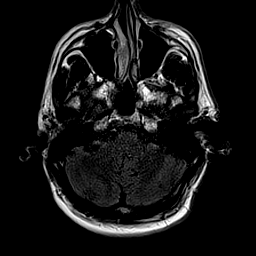
[im 19/47]
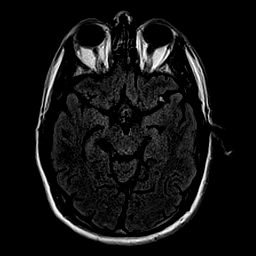
[im 28/47]
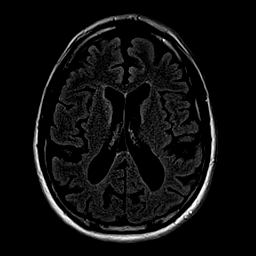
[im 37/47]
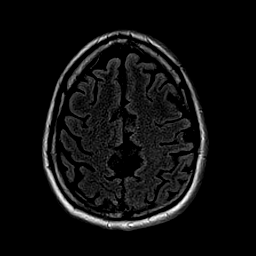
[im 47/47]
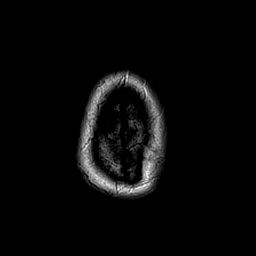

[Series 9: T1 · axial · 2.0mm · 0.40mm/px · z∈[-67,+83]mm · 8 of 76 slices shown]
[im 1/76]
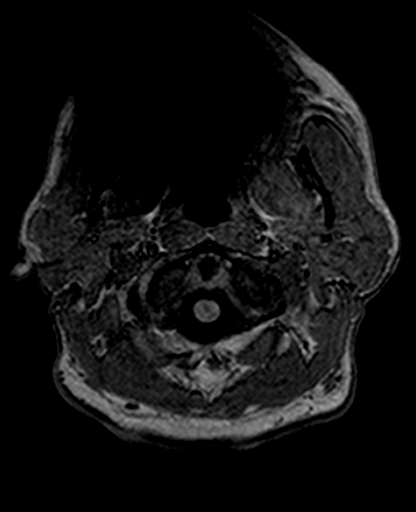
[im 10/76]
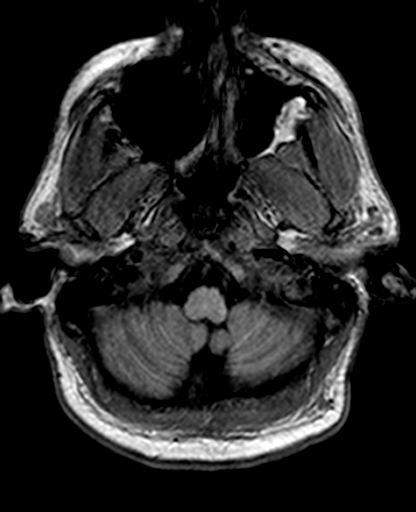
[im 19/76]
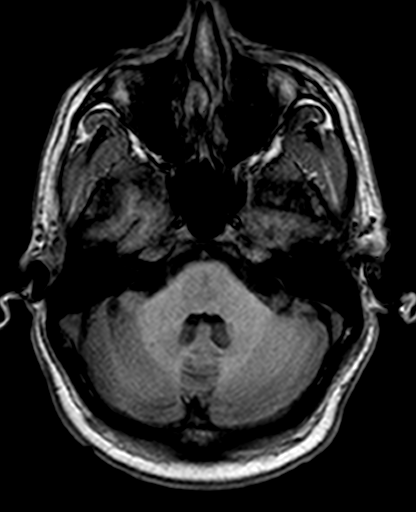
[im 29/76]
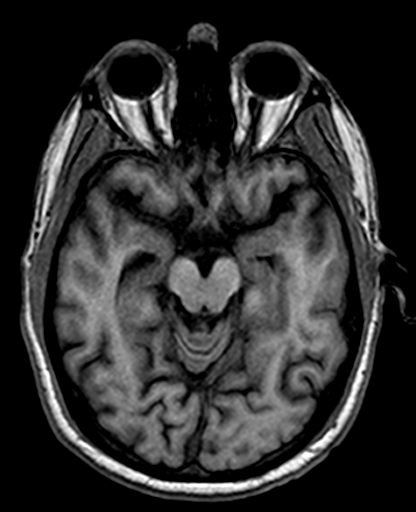
[im 47/76]
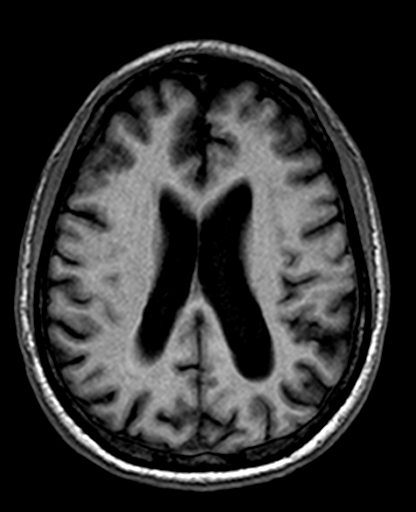
[im 57/76]
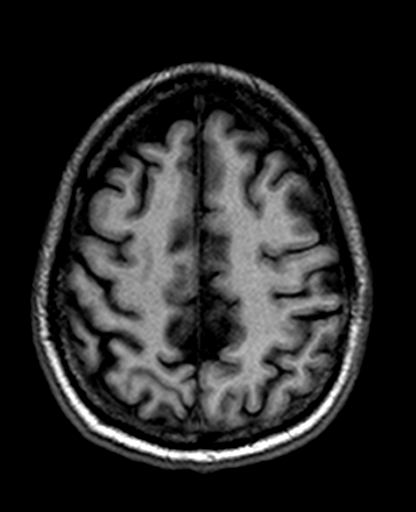
[im 66/76]
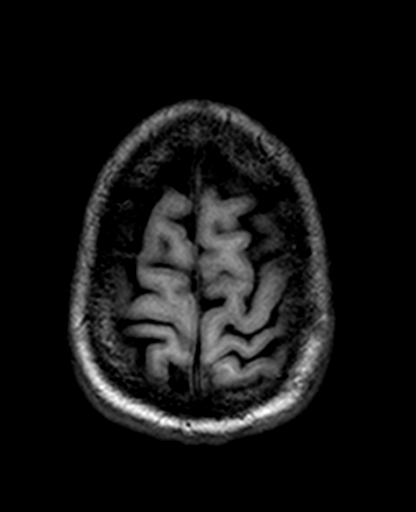
[im 76/76]
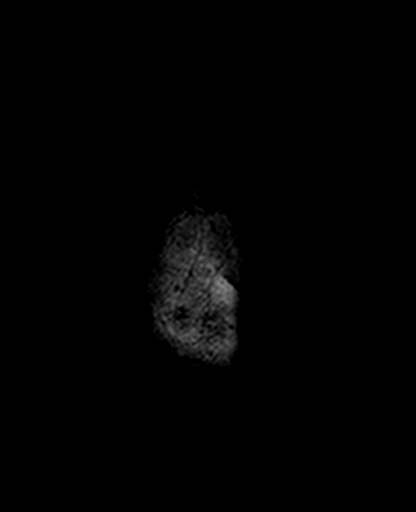

[Series 11: T2 · coronal · 5.0mm · 0.58mm/px · 3 of 28 slices shown (2 of 2)]
[im 1/28]
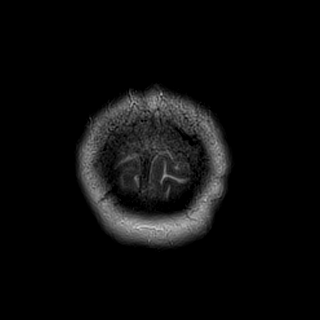
[im 14/28]
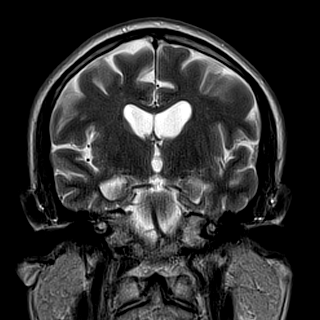
[im 28/28]
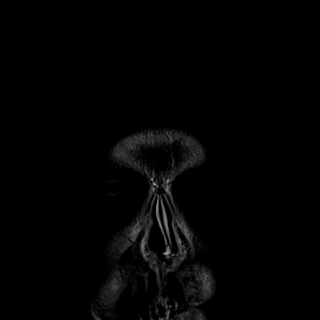

[42 of 48 positions shown; findings below may reference images not displayed]

FINDINGS: Brain: Moderate generalized atrophy without hydrocephalus. Negative
for acute infarct. Mild chronic changes in the white matter and
pons. Negative for hemorrhage or mass

Vascular: Normal arterial flow voids

Skull and upper cervical spine: Negative

Sinuses/Orbits: Paranasal sinuses clear. No orbital lesion. Cataract
surgery on the left

Other: None
IMPRESSION: Moderate atrophy with mild chronic microvascular ischemia. No acute
abnormality.

## 2019-06-26 ENCOUNTER — Telehealth: Payer: Self-pay

## 2019-06-26 NOTE — Telephone Encounter (Signed)
Patient wife called stating the lawyer needs a letter stating that patient is able to sign legal documents.

## 2019-07-01 ENCOUNTER — Encounter: Payer: Self-pay | Admitting: Psychology

## 2019-12-22 ENCOUNTER — Ambulatory Visit: Payer: BLUE CROSS/BLUE SHIELD | Admitting: Psychology

## 2019-12-25 DIAGNOSIS — G319 Degenerative disease of nervous system, unspecified: Secondary | ICD-10-CM | POA: Diagnosis not present

## 2019-12-25 DIAGNOSIS — Z6824 Body mass index (BMI) 24.0-24.9, adult: Secondary | ICD-10-CM | POA: Diagnosis not present

## 2020-06-06 DIAGNOSIS — E559 Vitamin D deficiency, unspecified: Secondary | ICD-10-CM | POA: Diagnosis not present

## 2020-06-06 DIAGNOSIS — R7303 Prediabetes: Secondary | ICD-10-CM | POA: Diagnosis not present

## 2020-06-06 DIAGNOSIS — E785 Hyperlipidemia, unspecified: Secondary | ICD-10-CM | POA: Diagnosis not present

## 2020-06-06 DIAGNOSIS — Z79899 Other long term (current) drug therapy: Secondary | ICD-10-CM | POA: Diagnosis not present

## 2020-06-06 DIAGNOSIS — G309 Alzheimer's disease, unspecified: Secondary | ICD-10-CM | POA: Diagnosis not present

## 2020-06-16 DIAGNOSIS — Z6823 Body mass index (BMI) 23.0-23.9, adult: Secondary | ICD-10-CM | POA: Diagnosis not present

## 2020-06-16 DIAGNOSIS — Z0000 Encounter for general adult medical examination without abnormal findings: Secondary | ICD-10-CM | POA: Diagnosis not present

## 2020-07-12 DIAGNOSIS — Z79899 Other long term (current) drug therapy: Secondary | ICD-10-CM | POA: Diagnosis not present

## 2020-07-12 DIAGNOSIS — R269 Unspecified abnormalities of gait and mobility: Secondary | ICD-10-CM | POA: Diagnosis not present

## 2020-07-12 DIAGNOSIS — R251 Tremor, unspecified: Secondary | ICD-10-CM | POA: Diagnosis not present

## 2020-07-12 DIAGNOSIS — F0390 Unspecified dementia without behavioral disturbance: Secondary | ICD-10-CM | POA: Diagnosis not present

## 2020-07-12 DIAGNOSIS — H47619 Cortical blindness, unspecified side of brain: Secondary | ICD-10-CM | POA: Diagnosis not present

## 2020-08-12 DIAGNOSIS — F0390 Unspecified dementia without behavioral disturbance: Secondary | ICD-10-CM | POA: Diagnosis not present

## 2020-08-12 DIAGNOSIS — U071 COVID-19: Secondary | ICD-10-CM | POA: Diagnosis not present

## 2020-10-18 DIAGNOSIS — G309 Alzheimer's disease, unspecified: Secondary | ICD-10-CM | POA: Diagnosis not present

## 2020-10-18 DIAGNOSIS — R269 Unspecified abnormalities of gait and mobility: Secondary | ICD-10-CM | POA: Diagnosis not present

## 2020-10-18 DIAGNOSIS — F0390 Unspecified dementia without behavioral disturbance: Secondary | ICD-10-CM | POA: Diagnosis not present

## 2020-10-18 DIAGNOSIS — Z79899 Other long term (current) drug therapy: Secondary | ICD-10-CM | POA: Diagnosis not present

## 2020-10-18 DIAGNOSIS — H47619 Cortical blindness, unspecified side of brain: Secondary | ICD-10-CM | POA: Diagnosis not present

## 2020-10-18 DIAGNOSIS — R251 Tremor, unspecified: Secondary | ICD-10-CM | POA: Diagnosis not present

## 2020-11-30 DIAGNOSIS — G40209 Localization-related (focal) (partial) symptomatic epilepsy and epileptic syndromes with complex partial seizures, not intractable, without status epilepticus: Secondary | ICD-10-CM | POA: Diagnosis not present

## 2021-03-18 ENCOUNTER — Ambulatory Visit
Admission: EM | Admit: 2021-03-18 | Discharge: 2021-03-18 | Disposition: A | Discharge status: Home / Self Care | Payer: PPO

## 2021-03-18 ENCOUNTER — Other Ambulatory Visit: Payer: Self-pay

## 2021-03-18 ENCOUNTER — Encounter: Payer: Self-pay | Admitting: Emergency Medicine

## 2021-03-18 DIAGNOSIS — R319 Hematuria, unspecified: Secondary | ICD-10-CM | POA: Diagnosis not present

## 2021-03-18 DIAGNOSIS — M545 Low back pain, unspecified: Secondary | ICD-10-CM

## 2021-03-18 DIAGNOSIS — Z87442 Personal history of urinary calculi: Secondary | ICD-10-CM

## 2021-03-18 DIAGNOSIS — N23 Unspecified renal colic: Secondary | ICD-10-CM

## 2021-03-18 DIAGNOSIS — R102 Pelvic and perineal pain: Secondary | ICD-10-CM

## 2021-03-18 HISTORY — DX: Dementia in other diseases classified elsewhere, unspecified severity, without behavioral disturbance, psychotic disturbance, mood disturbance, and anxiety: F02.80

## 2021-03-18 LAB — POCT URINALYSIS DIP (MANUAL ENTRY)
Bilirubin, UA: NEGATIVE
Glucose, UA: NEGATIVE mg/dL
Ketones, POC UA: NEGATIVE mg/dL
Leukocytes, UA: NEGATIVE
Nitrite, UA: NEGATIVE
Protein Ur, POC: NEGATIVE mg/dL
Spec Grav, UA: <=1.005 — AB (ref 1.010–1.025)
Urobilinogen, UA: 0.2 E.U./dL
pH, UA: 6 (ref 5.0–8.0)

## 2021-03-18 MED ORDER — TAMSULOSIN HCL 0.4 MG PO CAPS: 0.4000 mg | ORAL_CAPSULE | Freq: Every day | ORAL | 0 refills | Status: DC

## 2021-03-18 MED ORDER — NAPROXEN 375 MG PO TABS: 375.0000 mg | ORAL_TABLET | Freq: Two times a day (BID) | ORAL | 0 refills | Status: DC
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
## 2021-03-18 NOTE — Discharge Instructions (Addendum)
Please start Flomax to help you pass a kidney stone. Strain the urine in case you pass it, bring the stone in for analysis. Make sure you hydrate with plain water at 64-80 ounces daily. Follow up with Dr. Teresa Pelton asap. If you develop high fever, severe pain, vomiting, stop being able to urinate go the hospital.

## 2021-03-18 NOTE — ED Triage Notes (Signed)
Dark urine, c/o pressure and lower back pain.  Pain started this morning.  Dark urine for a couple of days.

## 2021-03-21 DIAGNOSIS — R319 Hematuria, unspecified: Secondary | ICD-10-CM | POA: Diagnosis not present

## 2021-03-21 DIAGNOSIS — E663 Overweight: Secondary | ICD-10-CM | POA: Diagnosis not present

## 2021-03-21 DIAGNOSIS — Z6822 Body mass index (BMI) 22.0-22.9, adult: Secondary | ICD-10-CM | POA: Diagnosis not present

## 2021-03-21 DIAGNOSIS — F0390 Unspecified dementia without behavioral disturbance: Secondary | ICD-10-CM | POA: Diagnosis not present

## 2021-03-21 DIAGNOSIS — E785 Hyperlipidemia, unspecified: Secondary | ICD-10-CM | POA: Diagnosis not present

## 2021-03-28 DIAGNOSIS — H47619 Cortical blindness, unspecified side of brain: Secondary | ICD-10-CM | POA: Diagnosis not present

## 2021-03-28 DIAGNOSIS — R251 Tremor, unspecified: Secondary | ICD-10-CM | POA: Diagnosis not present

## 2021-03-28 DIAGNOSIS — Z79899 Other long term (current) drug therapy: Secondary | ICD-10-CM | POA: Diagnosis not present

## 2021-03-28 DIAGNOSIS — R269 Unspecified abnormalities of gait and mobility: Secondary | ICD-10-CM | POA: Diagnosis not present

## 2021-03-28 DIAGNOSIS — G309 Alzheimer's disease, unspecified: Secondary | ICD-10-CM | POA: Diagnosis not present

## 2021-05-22 DIAGNOSIS — E559 Vitamin D deficiency, unspecified: Secondary | ICD-10-CM | POA: Diagnosis not present

## 2021-05-22 DIAGNOSIS — F419 Anxiety disorder, unspecified: Secondary | ICD-10-CM | POA: Diagnosis not present

## 2021-05-22 DIAGNOSIS — E785 Hyperlipidemia, unspecified: Secondary | ICD-10-CM | POA: Diagnosis not present

## 2021-05-22 DIAGNOSIS — Z87891 Personal history of nicotine dependence: Secondary | ICD-10-CM | POA: Diagnosis not present

## 2021-05-22 DIAGNOSIS — Z9849 Cataract extraction status, unspecified eye: Secondary | ICD-10-CM | POA: Diagnosis not present

## 2021-05-22 DIAGNOSIS — F0390 Unspecified dementia without behavioral disturbance: Secondary | ICD-10-CM | POA: Diagnosis not present

## 2021-05-24 DIAGNOSIS — G300 Alzheimer's disease with early onset: Secondary | ICD-10-CM | POA: Diagnosis not present

## 2021-05-24 DIAGNOSIS — R2689 Other abnormalities of gait and mobility: Secondary | ICD-10-CM | POA: Diagnosis not present

## 2021-05-24 DIAGNOSIS — Z79899 Other long term (current) drug therapy: Secondary | ICD-10-CM | POA: Diagnosis not present

## 2021-05-24 DIAGNOSIS — R030 Elevated blood-pressure reading, without diagnosis of hypertension: Secondary | ICD-10-CM | POA: Diagnosis not present

## 2021-05-31 DIAGNOSIS — R26 Abnormalities of gait and mobility: Secondary | ICD-10-CM | POA: Diagnosis not present

## 2021-07-14 ENCOUNTER — Encounter: Payer: Self-pay | Admitting: Internal Medicine

## 2021-09-05 ENCOUNTER — Telehealth: Payer: Self-pay | Admitting: Internal Medicine

## 2021-09-05 NOTE — Telephone Encounter (Unsigned)
Left message for pts wife to call back.  Wife never returned call, will await further communication from pt/pts wife.

## 2021-09-06 NOTE — Telephone Encounter (Signed)
If his Alzheimer's is so severe that he could not drink colonoscopy prep, then no plans for colonoscopy. Cologuard would not be appropriate in a patient with a history of colon polyps and a family history of colon cancer.

## 2021-09-06 NOTE — Telephone Encounter (Signed)
Pts wife called back and states that pt was dx with alzheimer's 3 years ago. She states she does not think there is any way he can drink the prep for the colonoscopy. She asked about doing cologuard but pt does have a hx of colon polyps. She also didn't know if it was a good idea to be sedated for the procedure. She wanted to see what Dr. Henrene Pastor thought. Please advise.

## 2021-09-06 NOTE — Telephone Encounter (Signed)
Spoke with pts wife and she is aware of Dr. Perry's recommendations. ?

## 2021-09-27 DIAGNOSIS — H47619 Cortical blindness, unspecified side of brain: Secondary | ICD-10-CM | POA: Diagnosis not present

## 2021-09-27 DIAGNOSIS — Z79899 Other long term (current) drug therapy: Secondary | ICD-10-CM | POA: Diagnosis not present

## 2021-09-27 DIAGNOSIS — R2689 Other abnormalities of gait and mobility: Secondary | ICD-10-CM | POA: Diagnosis not present

## 2021-09-27 DIAGNOSIS — G300 Alzheimer's disease with early onset: Secondary | ICD-10-CM | POA: Diagnosis not present

## 2021-10-04 DIAGNOSIS — J302 Other seasonal allergic rhinitis: Secondary | ICD-10-CM | POA: Diagnosis not present

## 2021-10-04 DIAGNOSIS — R2689 Other abnormalities of gait and mobility: Secondary | ICD-10-CM | POA: Diagnosis not present

## 2021-10-04 DIAGNOSIS — H47619 Cortical blindness, unspecified side of brain: Secondary | ICD-10-CM | POA: Diagnosis not present

## 2021-10-04 DIAGNOSIS — G300 Alzheimer's disease with early onset: Secondary | ICD-10-CM | POA: Diagnosis not present

## 2021-10-04 DIAGNOSIS — R251 Tremor, unspecified: Secondary | ICD-10-CM | POA: Diagnosis not present

## 2021-10-04 DIAGNOSIS — F0280 Dementia in other diseases classified elsewhere without behavioral disturbance: Secondary | ICD-10-CM | POA: Diagnosis not present

## 2021-10-06 DIAGNOSIS — R269 Unspecified abnormalities of gait and mobility: Secondary | ICD-10-CM | POA: Diagnosis not present

## 2021-10-18 DIAGNOSIS — J302 Other seasonal allergic rhinitis: Secondary | ICD-10-CM | POA: Diagnosis not present

## 2021-10-18 DIAGNOSIS — G300 Alzheimer's disease with early onset: Secondary | ICD-10-CM | POA: Diagnosis not present

## 2021-10-18 DIAGNOSIS — R2689 Other abnormalities of gait and mobility: Secondary | ICD-10-CM | POA: Diagnosis not present

## 2021-10-18 DIAGNOSIS — F0280 Dementia in other diseases classified elsewhere without behavioral disturbance: Secondary | ICD-10-CM | POA: Diagnosis not present

## 2021-10-18 DIAGNOSIS — H47619 Cortical blindness, unspecified side of brain: Secondary | ICD-10-CM | POA: Diagnosis not present

## 2021-10-18 DIAGNOSIS — R251 Tremor, unspecified: Secondary | ICD-10-CM | POA: Diagnosis not present

## 2021-10-19 DIAGNOSIS — G300 Alzheimer's disease with early onset: Secondary | ICD-10-CM | POA: Diagnosis not present

## 2021-10-19 DIAGNOSIS — J302 Other seasonal allergic rhinitis: Secondary | ICD-10-CM | POA: Diagnosis not present

## 2021-10-19 DIAGNOSIS — R2689 Other abnormalities of gait and mobility: Secondary | ICD-10-CM | POA: Diagnosis not present

## 2021-10-19 DIAGNOSIS — H47619 Cortical blindness, unspecified side of brain: Secondary | ICD-10-CM | POA: Diagnosis not present

## 2021-10-19 DIAGNOSIS — R251 Tremor, unspecified: Secondary | ICD-10-CM | POA: Diagnosis not present

## 2021-10-19 DIAGNOSIS — F0280 Dementia in other diseases classified elsewhere without behavioral disturbance: Secondary | ICD-10-CM | POA: Diagnosis not present

## 2021-11-01 DIAGNOSIS — R2689 Other abnormalities of gait and mobility: Secondary | ICD-10-CM | POA: Diagnosis not present

## 2021-11-01 DIAGNOSIS — G300 Alzheimer's disease with early onset: Secondary | ICD-10-CM | POA: Diagnosis not present

## 2021-11-01 DIAGNOSIS — J302 Other seasonal allergic rhinitis: Secondary | ICD-10-CM | POA: Diagnosis not present

## 2021-11-01 DIAGNOSIS — F0280 Dementia in other diseases classified elsewhere without behavioral disturbance: Secondary | ICD-10-CM | POA: Diagnosis not present

## 2021-11-01 DIAGNOSIS — H47619 Cortical blindness, unspecified side of brain: Secondary | ICD-10-CM | POA: Diagnosis not present

## 2021-11-01 DIAGNOSIS — R251 Tremor, unspecified: Secondary | ICD-10-CM | POA: Diagnosis not present

## 2021-11-06 DIAGNOSIS — R269 Unspecified abnormalities of gait and mobility: Secondary | ICD-10-CM | POA: Diagnosis not present

## 2021-11-08 DIAGNOSIS — R2689 Other abnormalities of gait and mobility: Secondary | ICD-10-CM | POA: Diagnosis not present

## 2021-11-08 DIAGNOSIS — G300 Alzheimer's disease with early onset: Secondary | ICD-10-CM | POA: Diagnosis not present

## 2021-11-08 DIAGNOSIS — F0280 Dementia in other diseases classified elsewhere without behavioral disturbance: Secondary | ICD-10-CM | POA: Diagnosis not present

## 2021-11-08 DIAGNOSIS — H47619 Cortical blindness, unspecified side of brain: Secondary | ICD-10-CM | POA: Diagnosis not present

## 2021-11-08 DIAGNOSIS — R251 Tremor, unspecified: Secondary | ICD-10-CM | POA: Diagnosis not present

## 2021-11-08 DIAGNOSIS — J302 Other seasonal allergic rhinitis: Secondary | ICD-10-CM | POA: Diagnosis not present

## 2021-11-14 DIAGNOSIS — R26 Abnormalities of gait and mobility: Secondary | ICD-10-CM | POA: Diagnosis not present

## 2021-11-15 DIAGNOSIS — J302 Other seasonal allergic rhinitis: Secondary | ICD-10-CM | POA: Diagnosis not present

## 2021-11-15 DIAGNOSIS — F0280 Dementia in other diseases classified elsewhere without behavioral disturbance: Secondary | ICD-10-CM | POA: Diagnosis not present

## 2021-11-15 DIAGNOSIS — R251 Tremor, unspecified: Secondary | ICD-10-CM | POA: Diagnosis not present

## 2021-11-15 DIAGNOSIS — R2689 Other abnormalities of gait and mobility: Secondary | ICD-10-CM | POA: Diagnosis not present

## 2021-11-15 DIAGNOSIS — G300 Alzheimer's disease with early onset: Secondary | ICD-10-CM | POA: Diagnosis not present

## 2021-11-15 DIAGNOSIS — H47619 Cortical blindness, unspecified side of brain: Secondary | ICD-10-CM | POA: Diagnosis not present

## 2021-11-20 DIAGNOSIS — Z0001 Encounter for general adult medical examination with abnormal findings: Secondary | ICD-10-CM | POA: Diagnosis not present

## 2021-11-20 DIAGNOSIS — Z1331 Encounter for screening for depression: Secondary | ICD-10-CM | POA: Diagnosis not present

## 2021-11-20 DIAGNOSIS — Z125 Encounter for screening for malignant neoplasm of prostate: Secondary | ICD-10-CM | POA: Diagnosis not present

## 2021-11-20 DIAGNOSIS — R319 Hematuria, unspecified: Secondary | ICD-10-CM | POA: Diagnosis not present

## 2021-11-20 DIAGNOSIS — E785 Hyperlipidemia, unspecified: Secondary | ICD-10-CM | POA: Diagnosis not present

## 2021-11-20 DIAGNOSIS — F0390 Unspecified dementia without behavioral disturbance: Secondary | ICD-10-CM | POA: Diagnosis not present

## 2021-11-20 DIAGNOSIS — Z6822 Body mass index (BMI) 22.0-22.9, adult: Secondary | ICD-10-CM | POA: Diagnosis not present

## 2021-12-05 DIAGNOSIS — Z79899 Other long term (current) drug therapy: Secondary | ICD-10-CM | POA: Diagnosis not present

## 2021-12-05 DIAGNOSIS — G300 Alzheimer's disease with early onset: Secondary | ICD-10-CM | POA: Diagnosis not present

## 2021-12-05 DIAGNOSIS — R2689 Other abnormalities of gait and mobility: Secondary | ICD-10-CM | POA: Diagnosis not present

## 2021-12-05 DIAGNOSIS — H47619 Cortical blindness, unspecified side of brain: Secondary | ICD-10-CM | POA: Diagnosis not present

## 2021-12-06 DIAGNOSIS — R269 Unspecified abnormalities of gait and mobility: Secondary | ICD-10-CM | POA: Diagnosis not present

## 2022-01-06 DIAGNOSIS — R269 Unspecified abnormalities of gait and mobility: Secondary | ICD-10-CM | POA: Diagnosis not present

## 2022-02-05 ENCOUNTER — Encounter: Payer: Self-pay | Admitting: *Deleted

## 2022-02-06 ENCOUNTER — Ambulatory Visit: Payer: PPO | Admitting: Neurology

## 2022-02-06 ENCOUNTER — Encounter: Payer: Self-pay | Admitting: Neurology

## 2022-02-06 VITALS — BP 149/90 | HR 66 | Ht 66.0 in | Wt 149.0 lb

## 2022-02-06 DIAGNOSIS — Z7409 Other reduced mobility: Secondary | ICD-10-CM

## 2022-02-06 DIAGNOSIS — F03C0 Unspecified dementia, severe, without behavioral disturbance, psychotic disturbance, mood disturbance, and anxiety: Secondary | ICD-10-CM | POA: Diagnosis not present

## 2022-02-06 DIAGNOSIS — H547 Unspecified visual loss: Secondary | ICD-10-CM

## 2022-02-06 DIAGNOSIS — H47619 Cortical blindness, unspecified side of brain: Secondary | ICD-10-CM

## 2022-02-06 DIAGNOSIS — R259 Unspecified abnormal involuntary movements: Secondary | ICD-10-CM
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
## 2022-02-06 NOTE — Patient Instructions (Signed)
It was nice to meet you today. As discussed, you can maintain your memory medications at the current dose.  You can request refills through [facility] office. Keeping a safe and structured living environment, supportive care, fall prevention, these are going to be key elements in your care. We will do an EEG (brainwave test), which we will schedule. We will call you with the results. For better management of your anxiety and depression, I recommend follow-up with primary care and the possibility of a referral to psychiatry.  At this juncture, I recommend that you follow-up closely with your primary care, I would be happy to see you back as needed.  You have had extensive workup through Dr. Freddie Apley office which I reviewed.  Unfortunately, there is not a whole lot I can add at this time.  Thankfully, you have a very good support system.

## 2022-03-12 ENCOUNTER — Other Ambulatory Visit: Payer: PPO | Admitting: *Deleted

## 2022-05-31 DIAGNOSIS — I10 Essential (primary) hypertension: Secondary | ICD-10-CM | POA: Diagnosis not present

## 2022-05-31 DIAGNOSIS — R319 Hematuria, unspecified: Secondary | ICD-10-CM | POA: Diagnosis not present

## 2022-05-31 DIAGNOSIS — F0390 Unspecified dementia without behavioral disturbance: Secondary | ICD-10-CM | POA: Diagnosis not present

## 2022-06-15 DIAGNOSIS — R319 Hematuria, unspecified: Secondary | ICD-10-CM | POA: Diagnosis not present

## 2022-07-02 DIAGNOSIS — I10 Essential (primary) hypertension: Secondary | ICD-10-CM | POA: Diagnosis not present

## 2022-07-10 ENCOUNTER — Ambulatory Visit: Payer: PPO | Admitting: Urology

## 2022-07-26 DIAGNOSIS — E785 Hyperlipidemia, unspecified: Secondary | ICD-10-CM | POA: Diagnosis not present

## 2022-07-26 DIAGNOSIS — F02818 Dementia in other diseases classified elsewhere, unspecified severity, with other behavioral disturbance: Secondary | ICD-10-CM | POA: Diagnosis not present

## 2022-07-26 DIAGNOSIS — G300 Alzheimer's disease with early onset: Secondary | ICD-10-CM | POA: Diagnosis not present

## 2022-07-26 DIAGNOSIS — J309 Allergic rhinitis, unspecified: Secondary | ICD-10-CM | POA: Diagnosis not present

## 2022-10-02 DIAGNOSIS — U071 COVID-19: Secondary | ICD-10-CM | POA: Diagnosis not present

## 2022-10-02 DIAGNOSIS — I10 Essential (primary) hypertension: Secondary | ICD-10-CM | POA: Diagnosis not present

## 2022-10-02 DIAGNOSIS — G300 Alzheimer's disease with early onset: Secondary | ICD-10-CM | POA: Diagnosis not present

## 2022-11-05 DIAGNOSIS — F0283 Dementia in other diseases classified elsewhere, unspecified severity, with mood disturbance: Secondary | ICD-10-CM | POA: Diagnosis not present

## 2022-11-05 DIAGNOSIS — F419 Anxiety disorder, unspecified: Secondary | ICD-10-CM | POA: Diagnosis not present

## 2022-11-05 DIAGNOSIS — F32A Depression, unspecified: Secondary | ICD-10-CM | POA: Diagnosis not present

## 2022-11-05 DIAGNOSIS — G8929 Other chronic pain: Secondary | ICD-10-CM | POA: Diagnosis not present

## 2022-11-05 DIAGNOSIS — G4089 Other seizures: Secondary | ICD-10-CM | POA: Diagnosis not present

## 2022-11-05 DIAGNOSIS — Z9849 Cataract extraction status, unspecified eye: Secondary | ICD-10-CM | POA: Diagnosis not present

## 2022-11-05 DIAGNOSIS — G309 Alzheimer's disease, unspecified: Secondary | ICD-10-CM | POA: Diagnosis not present

## 2022-11-05 DIAGNOSIS — G250 Essential tremor: Secondary | ICD-10-CM | POA: Diagnosis not present

## 2022-11-05 DIAGNOSIS — R32 Unspecified urinary incontinence: Secondary | ICD-10-CM | POA: Diagnosis not present

## 2022-11-05 DIAGNOSIS — E785 Hyperlipidemia, unspecified: Secondary | ICD-10-CM | POA: Diagnosis not present

## 2022-11-05 DIAGNOSIS — F0284 Dementia in other diseases classified elsewhere, unspecified severity, with anxiety: Secondary | ICD-10-CM | POA: Diagnosis not present

## 2022-11-19 ENCOUNTER — Other Ambulatory Visit: Payer: Self-pay | Admitting: Family Medicine

## 2022-12-28 DEATH — deceased
